# Patient Record
Sex: Male | Born: 1970 | Race: White | Hispanic: No | Marital: Married | Smoking: Never smoker
Health system: Southern US, Community
[De-identification: ages and names within clinical notes are randomized; demographics above are authoritative.]

---

## 2008-06-08 ENCOUNTER — Ambulatory Visit (HOSPITAL_BASED_OUTPATIENT_CLINIC_OR_DEPARTMENT_OTHER): Admission: RE | Admit: 2008-06-08 | Discharge: 2008-06-08 | Payer: Self-pay | Admitting: Orthopedic Surgery

## 2010-08-13 ENCOUNTER — Encounter (HOSPITAL_COMMUNITY)
Admission: RE | Admit: 2010-08-13 | Discharge: 2010-08-13 | Disposition: A | Discharge status: Home / Self Care | Payer: 59 | Source: Ambulatory Visit | Attending: Neurosurgery | Admitting: Neurosurgery

## 2010-08-13 DIAGNOSIS — Z01818 Encounter for other preprocedural examination: Secondary | ICD-10-CM | POA: Insufficient documentation

## 2010-08-13 DIAGNOSIS — Z01812 Encounter for preprocedural laboratory examination: Secondary | ICD-10-CM | POA: Insufficient documentation

## 2010-08-13 LAB — CBC
HCT: 47.1 % (ref 39.0–52.0)
Hemoglobin: 16.6 g/dL (ref 13.0–17.0)
MCH: 31.3 pg (ref 26.0–34.0)
MCHC: 35.2 g/dL (ref 30.0–36.0)
MCV: 88.9 fL (ref 78.0–100.0)
Platelets: 186 10*3/uL (ref 150–400)
RBC: 5.3 MIL/uL (ref 4.22–5.81)
RDW: 12.4 % (ref 11.5–15.5)
WBC: 7.8 10*3/uL (ref 4.0–10.5)

## 2010-08-13 LAB — BASIC METABOLIC PANEL
BUN: 16 mg/dL (ref 6–23)
CO2: 29 mEq/L (ref 19–32)
Calcium: 9.8 mg/dL (ref 8.4–10.5)
Chloride: 103 mEq/L (ref 96–112)
Creatinine, Ser: 1.24 mg/dL (ref 0.4–1.5)
GFR calc Af Amer: >60 mL/min (ref >60)
GFR calc non Af Amer: >60 mL/min (ref >60)
Glucose, Bld: 103 mg/dL — ABNORMAL HIGH (ref 70–99)
Potassium: 4.1 mEq/L (ref 3.5–5.1)
Sodium: 138 mEq/L (ref 135–145)

## 2010-08-13 LAB — SURGICAL PCR SCREEN
MRSA, PCR: NEGATIVE
Staphylococcus aureus: NEGATIVE

## 2010-08-19 ENCOUNTER — Observation Stay (HOSPITAL_COMMUNITY)
Admission: RE | Admit: 2010-08-19 | Discharge: 2010-08-20 | Disposition: A | Discharge status: Home / Self Care | Payer: 59 | Source: Ambulatory Visit | Attending: Neurosurgery | Admitting: Neurosurgery

## 2010-08-19 ENCOUNTER — Observation Stay (HOSPITAL_COMMUNITY): Payer: 59

## 2010-08-19 DIAGNOSIS — M47812 Spondylosis without myelopathy or radiculopathy, cervical region: Secondary | ICD-10-CM | POA: Insufficient documentation

## 2010-08-19 DIAGNOSIS — M5020 Other cervical disc displacement, unspecified cervical region: Principal | ICD-10-CM | POA: Insufficient documentation

## 2010-08-19 DIAGNOSIS — M5030 Other cervical disc degeneration, unspecified cervical region: Secondary | ICD-10-CM | POA: Insufficient documentation

## 2010-08-19 DIAGNOSIS — Z01812 Encounter for preprocedural laboratory examination: Secondary | ICD-10-CM | POA: Insufficient documentation

## 2010-08-19 DIAGNOSIS — E785 Hyperlipidemia, unspecified: Secondary | ICD-10-CM | POA: Insufficient documentation

## 2010-08-19 DIAGNOSIS — K219 Gastro-esophageal reflux disease without esophagitis: Secondary | ICD-10-CM | POA: Insufficient documentation

## 2010-08-21 NOTE — Op Note (Signed)
[name], [name]               ACCOUNT NO.:  [id number]  MEDICAL RECORD NO.:  [id number]           PATIENT TYPE:  O  LOCATION:  3526                         FACILITY:  [facility]  PHYSICIAN:  Hewitt Shorts, M.D.DATE OF BIRTH:  1970/08/09  DATE OF PROCEDURE:  08/19/2010 DATE OF DISCHARGE:                              OPERATIVE REPORT   PREOPERATIVE DIAGNOSES:  C6-C7 cervical disk herniation, cervical degenerative disk disease, cervical spondylosis, and cervical radiculopathy.  POSTOPERATIVE DIAGNOSES:  C6-C7 cervical disk herniation, cervical degenerative disk disease, cervical spondylosis, and cervical radiculopathy.  PROCEDURE:  C6-C7 anterior cervical decompression and arthrodesis with allograft and Tether cervical plating.  SURGEON:  Hewitt Shorts, MD  ASSISTANT:  Clydene Fake, MD  ANESTHESIA:  General endotracheal.  INDICATIONS:  The patient is a 40 year old man who presented with an acute left C7 cervical radiculopathy.  MRI scan revealed a left C6-C7 foraminal disk herniation and decision made to proceed with single-level ACDF.  DESCRIPTION OF PROCEDURE:  The patient was brought to the operating room, placed under general endotracheal anesthesia.  The patient was placed in 10 pounds of Holter traction.  Neck was prepped with Betadine soap solution, draped in a sterile fashion.  A horizontal incision was made on the left side of the neck.  The line of the incision was infiltrated with local anesthetic with epinephrine.  An incision was made, carried down through the subcutaneous tissue and platysma. Bipolar cautery and electrocautery were used to maintain hemostasis. Dissection was carried down through the platysma and then through an avascular plane leaving the sternocleidomastoid, carotid artery, and jugular vein laterally and trachea and esophagus medially.  The ventral aspect of the vertebral column was identified and localizing x-ray taken and the  C6-C7 intervertebral disk space identified.  Diskectomy was begun with an incision of the annulus and continued with microcurettes and pituitary rongeurs.  Anterior osteophytic overgrowth was removed using the osteophyte removal tool and Kerrison punches.  The operating microscope was draped and brought in the field to provide additional navigation, illumination, and visualization.  The remainder of the decompression was performed using microdissection and microsurgical technique.  Cartilaginous endplates of the corresponding vertebra removed were using microcurettes along with the high-speed drill and posterior osteophytic overgrowth was removed using the high-speed drill along with a 2-mm Kerrison punch with a thin footplate.  The posterior longitudinal ligament was carefully removed and we were able to decompress spinal canal and thecal sac.  We then turned our attention to the neural foramina.  Decompression on the right side was done by removing spinal overgrowth and ligament, but on the left side, there was a significant disk herniation that was removed in a piecemeal fashion using a micro hook to mobilize the fragments and removing them with Kerrison punch or pituitary rongeur.  In the end, all loose fragments of disk material were removed from within the foramen and good decompression of the exiting nerve root was achieved.  Once decompression was completed, hemostasis was established with the use of Gelfoam soaked in thrombin.  We then selected a 7-mm allograft implant, it was hydrated in saline solution  and positioned in the intervertebral disk space and countersunk.  We then selected a 14-mm Tether cervical plate that was positioned over the fusion construct and secured to the vertebra with a pair of 4-mm screws bilaterally, placing fixed screws at C6 and variable screws at C7.  Each screw hole was drilled, the screws were placed in an alternating fashion and once all 4 screws  were in place, final tightening was performed.  An x-ray was taken which showed the graft, plate, and screws all in good position.  The alignment was good.  The wound was irrigated with bacitracin solution, checked for hemostasis which was established and confirmed and then we proceeded with closure.  This was closed with interrupted inverted 2-0 undyed Vicryl sutures, subcutaneous and subcuticular closed with interrupted inverted 3-0 undyed Vicryl sutures, skin was approximated with Dermabond.  Procedure was tolerated well.  Estimated blood loss was 75 mL.  Sponge count correct.  Following surgery, the patient is to be reversed from the anesthetic, extubated, and transferred to the recovery room for further care.     Hewitt Shorts, M.D.     RWN/MEDQ  D:  08/19/2010  T:  08/20/2010  Job:  [id number]  Electronically Signed by Shirlean Kelly M.D. on 08/21/2010 12:50:45 PM

## 2010-09-10 NOTE — Op Note (Signed)
NAMEJERARDO, COSTABILE               ACCOUNT NO.:  [id number]   MEDICAL RECORD NO.:  [id number]          PATIENT TYPE:  AMB   LOCATION:  DSC                          FACILITY:  [facility]   PHYSICIAN:  Rodney A. Mortenson, M.D.DATE OF BIRTH:  1970/06/24   DATE OF PROCEDURE:  06/08/2008  DATE OF DISCHARGE:                               OPERATIVE REPORT   JUSTIFICATION:  A 40 year old male sustained a fracture of his left  fibula which was oblique and an injury with a syndesmosis diastasis of  the left ankle.  The patient now admitted for open reduction and  internal fixation.  Complication are discussed preoperatively.  Questions are answered and encouraged.   PREOPERATIVE DIAGNOSIS:  Fracture of left distal fibula with syndesmosis  diastasis of left ankle.   POSTOPERATIVE DIAGNOSIS:  Fracture of left distal fibula with  syndesmosis diastasis of left ankle.   OPERATION:  Open reduction and internal fixation, fracture of fibula  with 7-hole side plate; repair of syndesmosis diastasis with TightRope.   SURGEON:  Lenard Galloway. Chaney Malling, MD   ASSISTANT:  Oris Drone. Petrarca, PA-C   ANESTHESIA:  General.   PROCEDURE:  The patient was placed on the operating table in supine  position.  A pneumatic tourniquet was applied above left upper thigh.  Entire left lower extremity was prepped with DuraPrep and draped out in  the usual manner.  Leg was wrapped out in Esmarch.  Tourniquet was  elevated.  C-arm was used throughout.  An incision was made over the  fibula, started about 1 cm from the tip of the fibula and carried  proximally for about 2 cm above the fracture site.  Soft tissues were  stripped off very carefully.  Care was taken to avoid injury to the  superficial nerves.  Self-retaining retractor was put in place.  Dissection was carried down to the fibula and dissection was carried  further proximal.  Good exposure was achieved.  Manual reduction of the  fracture was achieved and this was  held with bone clamps.  A 7-hole side  plate was then selected and placed over the fracture.  Three to four  screws were proximal to the fracture.  Drill holes were made, measured  and appropriate length screws were applied.  Cortical screws were placed  bicortically proximally and over the distal part of the fibula with 2  large cancellous screws.  C-arm was used throughout and an anatomic  reduction was achieved.  The diastasis was closed and drill hole was  placed through the hole in the plate through the fibula and across the  tibia to the medial side of the tibia using TightRope mechanism.  Guide  pin was passed and a cannulated drill bit was passed over the guide pin.  A small incision was made over the medial side.  Soft tissues were  stripped away using a curved clamp.  The TightRope mechanism was then  passed through the plate through the fibula and across the tibia to the  medial side above the medial malleolus.  Traction was placed on the  TightRope and the fixation  device medially flipped back down onto the  cortex.  Great deal of attention was then placed on the TightRope with  the diastasis reduced manually and then sutured down very snugly.  Excellent anatomic reduction of the talus in the mortise was achieved.  The diastasis was closed.  An anatomic reduction of the fractures were  achieved.  Extra TightRope was cut off with a knife.  Wound was  irrigated.  Vicryl was used to close the muscle layers and subcutaneous  tissue and stainless steel staples were used to close the skin.  Sterile  dressing was applied, and the patient returned to the recovery room in  excellent condition.  Technically, this went extremely well.   DISPOSITION:  1. Return to my office on Wednesday.  2. Percocet for pain.  3. Usual postop instructions.  4. Nonweightbearing on the left.      Rodney A. Chaney Malling, M.D.  Electronically Signed     RAM/MEDQ  D:  06/08/2008  T:  06/08/2008  Job:   [id number]

## 2012-01-01 ENCOUNTER — Other Ambulatory Visit: Payer: Self-pay | Admitting: Neurosurgery

## 2012-01-01 DIAGNOSIS — M542 Cervicalgia: Secondary | ICD-10-CM

## 2012-01-01 DIAGNOSIS — M47812 Spondylosis without myelopathy or radiculopathy, cervical region: Secondary | ICD-10-CM

## 2012-01-01 DIAGNOSIS — M5020 Other cervical disc displacement, unspecified cervical region: Secondary | ICD-10-CM

## 2012-01-01 DIAGNOSIS — M5030 Other cervical disc degeneration, unspecified cervical region: Secondary | ICD-10-CM

## 2012-01-12 ENCOUNTER — Ambulatory Visit
Admission: RE | Admit: 2012-01-12 | Discharge: 2012-01-12 | Disposition: A | Discharge status: Home / Self Care | Payer: 59 | Source: Ambulatory Visit | Attending: Neurosurgery | Admitting: Neurosurgery

## 2012-01-12 DIAGNOSIS — M5030 Other cervical disc degeneration, unspecified cervical region: Secondary | ICD-10-CM

## 2012-01-12 DIAGNOSIS — M47812 Spondylosis without myelopathy or radiculopathy, cervical region: Secondary | ICD-10-CM

## 2012-01-12 DIAGNOSIS — M5020 Other cervical disc displacement, unspecified cervical region: Secondary | ICD-10-CM

## 2012-01-12 DIAGNOSIS — M542 Cervicalgia: Secondary | ICD-10-CM

## 2012-02-16 IMAGING — CR DG CERVICAL SPINE 2 OR 3 VIEWS
1 series · 1 of 1 positions shown · non-contrast
Comparison: None.

CLINICAL DATA: C6-7 ACDF.

OPERATIVE CERVICAL SPINE - 2-3 VIEW 08/19/2010:

[view not recorded]
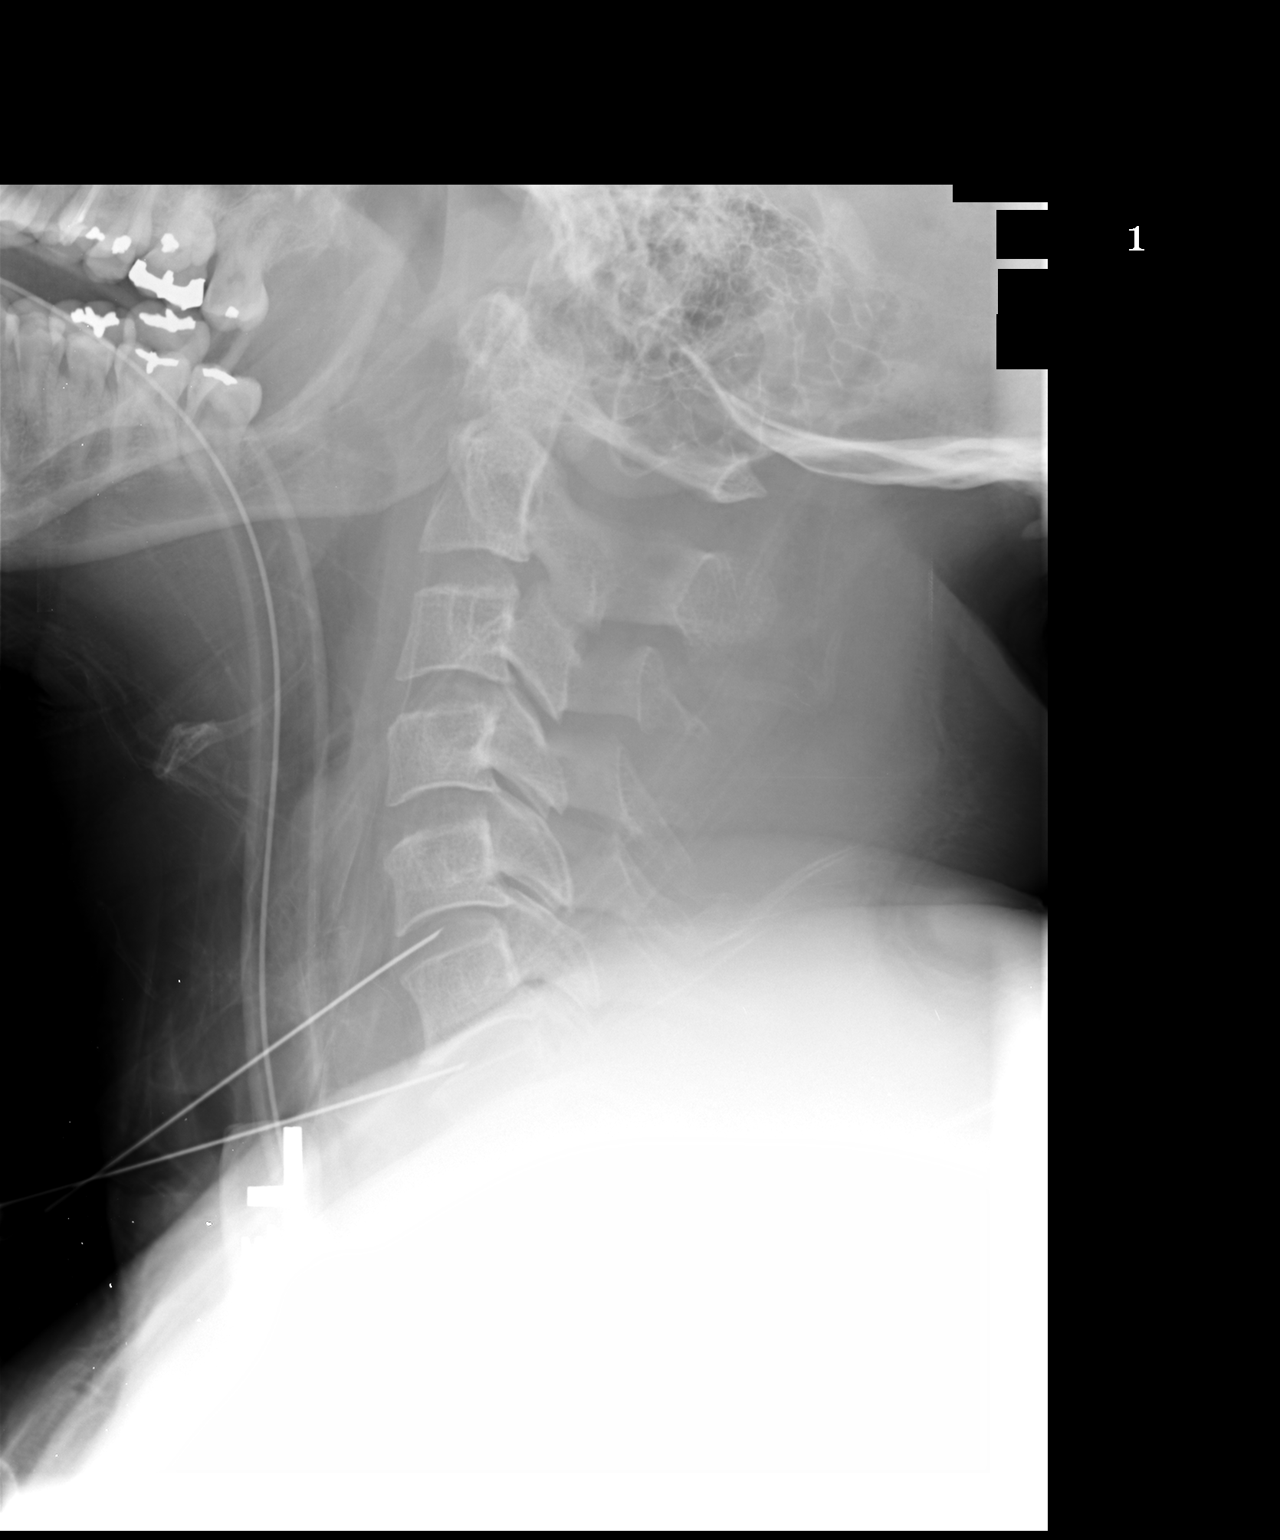

[1 of 1 positions shown; findings below may reference images not displayed]

FINDINGS: Images were submitted for interpretation post-
operatively.  Initial image obtained at 0302 hours demonstrates
localizer needles projected over the C5-6 and C6-7 intervertebral
discs.  Second image obtained at 4849 hours demonstrates ACDF with
hardware at the lower cervical level which I believe is C6-7, based
on the osteophyte arising from the inferior plate of C5.  Alignment
appears anatomic.  Bone fusion plug per disease in the disc space.
IMPRESSION: ACDF with hardware at C6-7 with anatomic alignment.

## 2013-07-11 IMAGING — CT CT CERVICAL SPINE W/O CM
4 series · 16 of 33 positions shown, 19 images · non-contrast
Comparison: Radiographs dated 08/19/2010

CLINICAL DATA: Neck pain.  Right hand numbness and tingling.
Anterior cervical fusion performed at C6-7 on 08/19/2010

CT CERVICAL SPINE WITHOUT CONTRAST
TECHNIQUE: Multidetector CT imaging of the cervical spine was
performed. Multiplanar CT image reconstructions were also
generated.

[Series 3: c spine bone · axial · 0.23mm/px · z∈[+80,+207]mm · 5 of 77 slices shown, 7 images]
[im 13/77  soft-tissue]
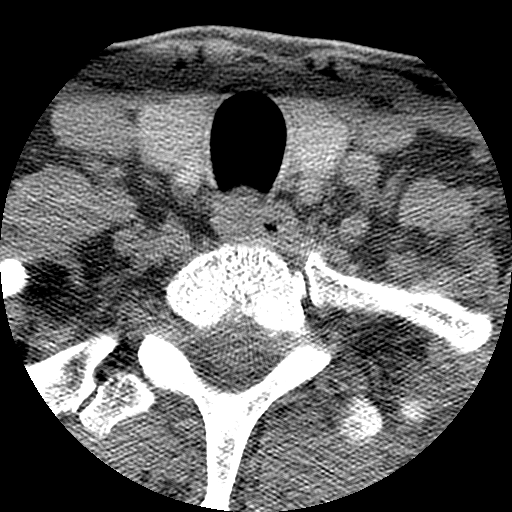
[im 13/77  bone]
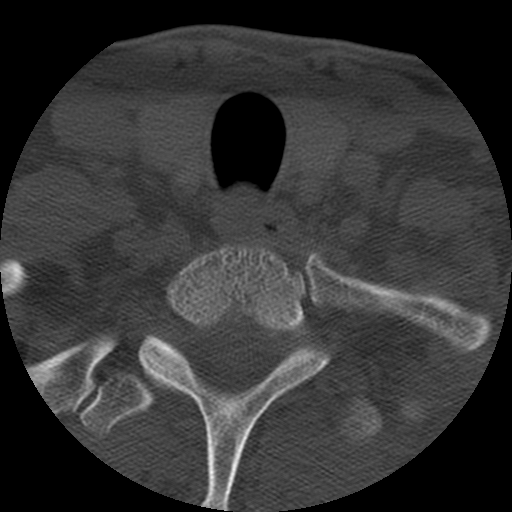
[im 26/77  bone]
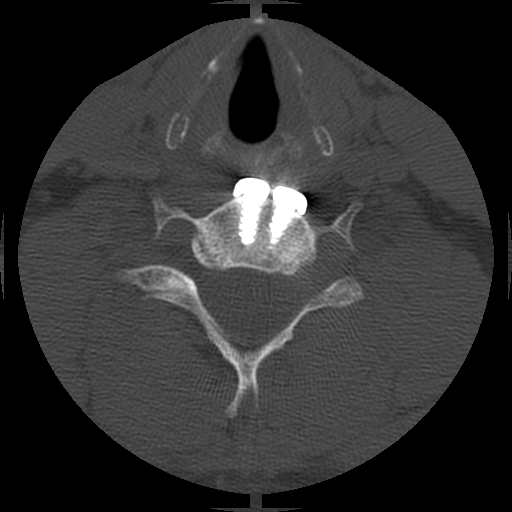
[im 39/77  bone]
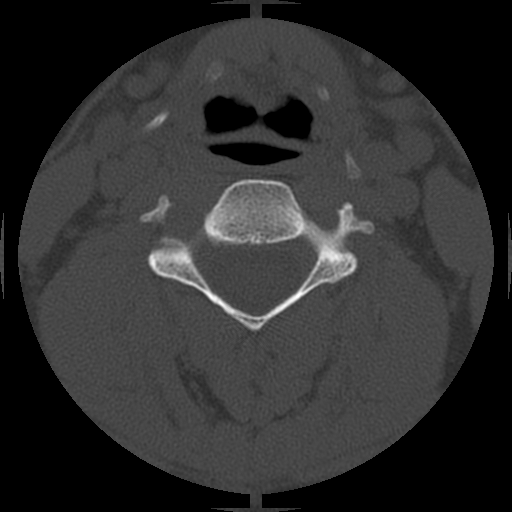
[im 51/77  bone]
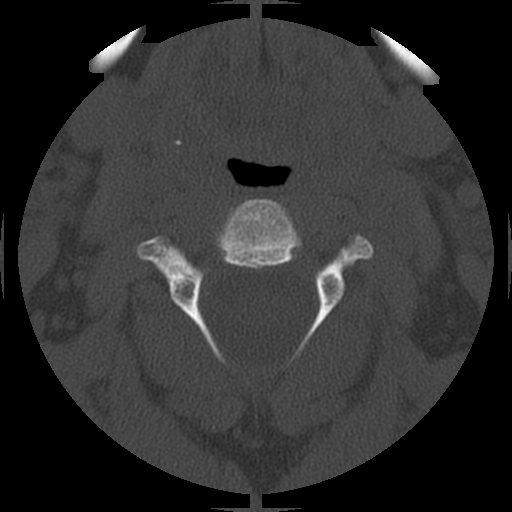
[im 64/77  soft-tissue]
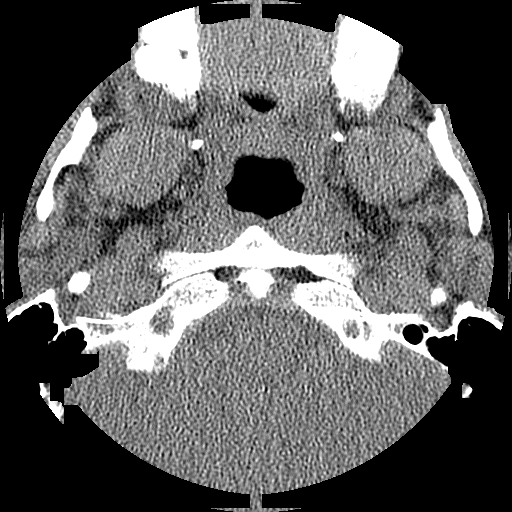
[im 64/77  bone]
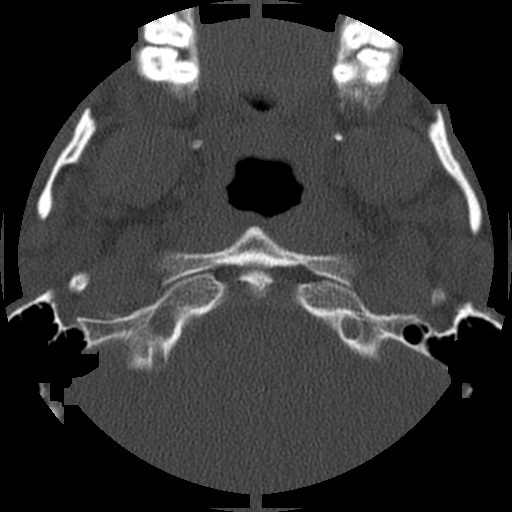

[Series 4: c spine soft · axial · 0.23mm/px · z∈[+80,+145]mm · 3 of 77 slices shown]
[im 13/77  soft-tissue]
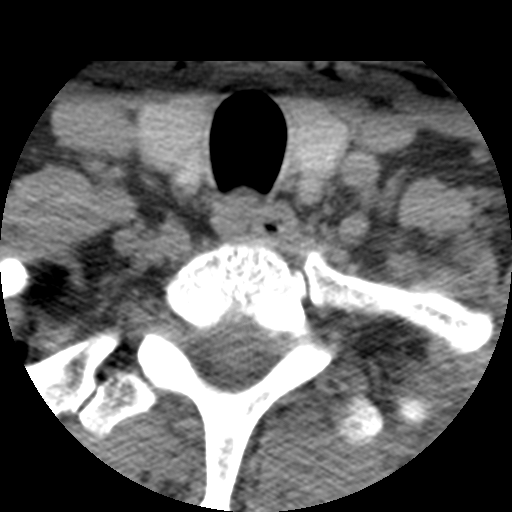
[im 26/77  soft-tissue]
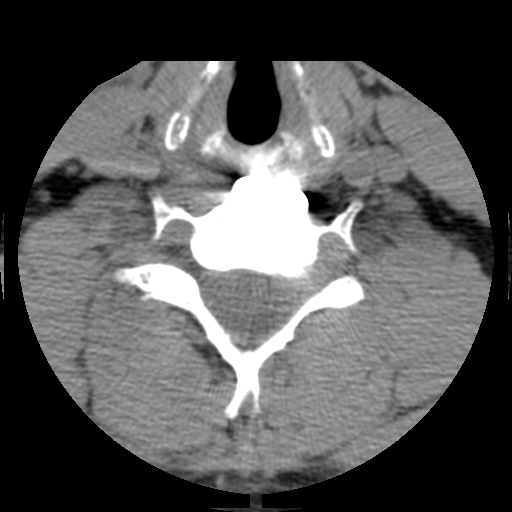
[im 39/77  soft-tissue]
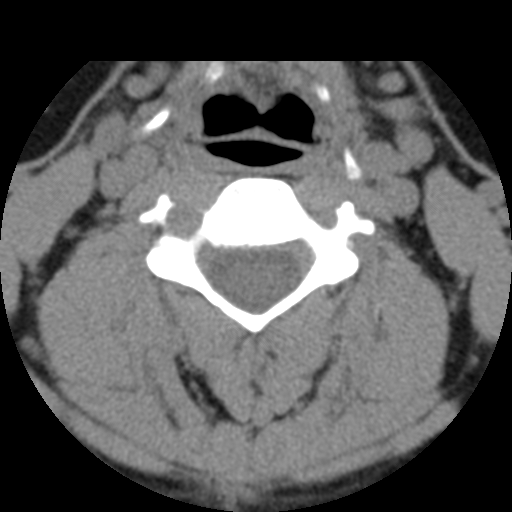

[Series 200: coronal · coronal · 0.38mm/px · 3 of 36 slices shown]
[im 8/36  bone]
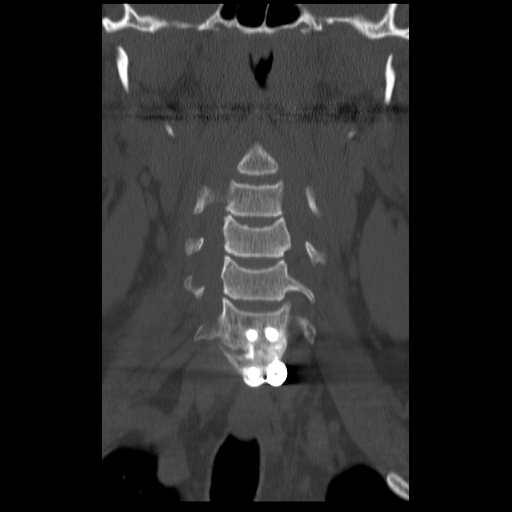
[im 15/36  bone]
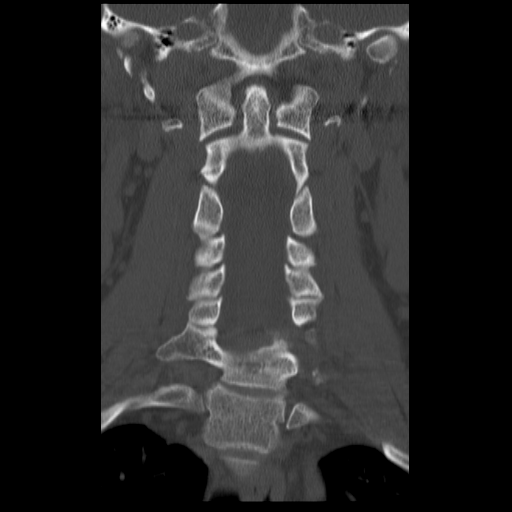
[im 22/36  bone]
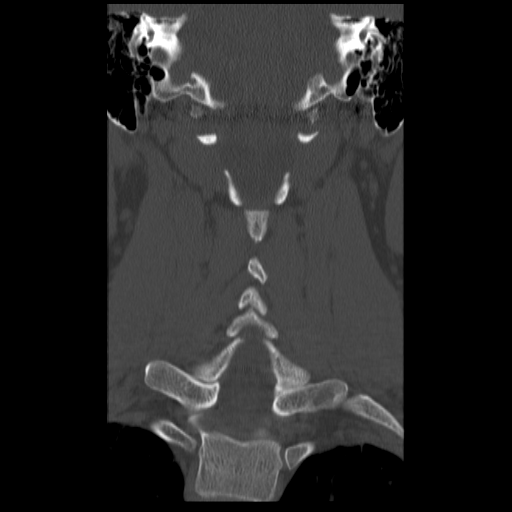

[Series 201: sagittal · sagittal · 0.38mm/px · 5 of 40 slices shown, 6 images]
[im 14/40  bone]
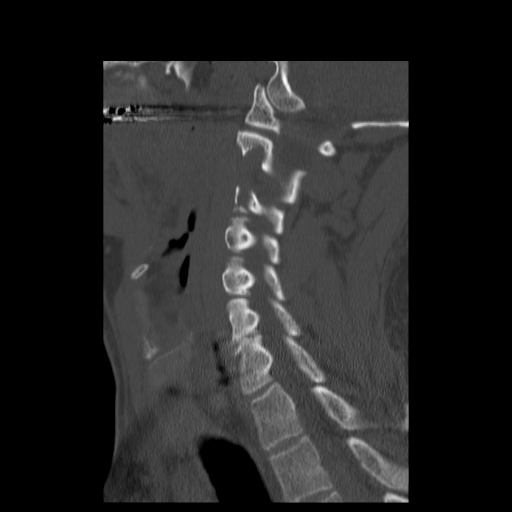
[im 17/40  bone]
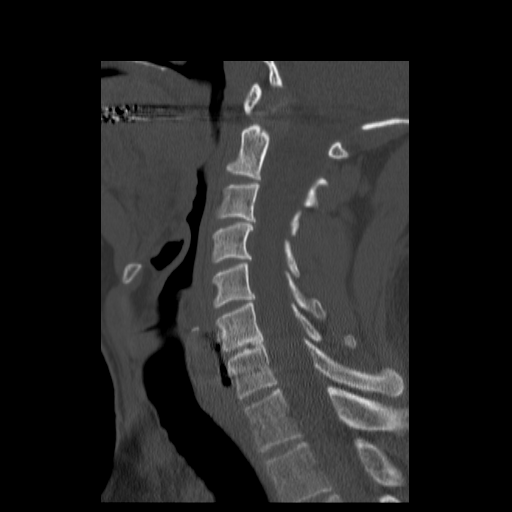
[im 20/40  soft-tissue]
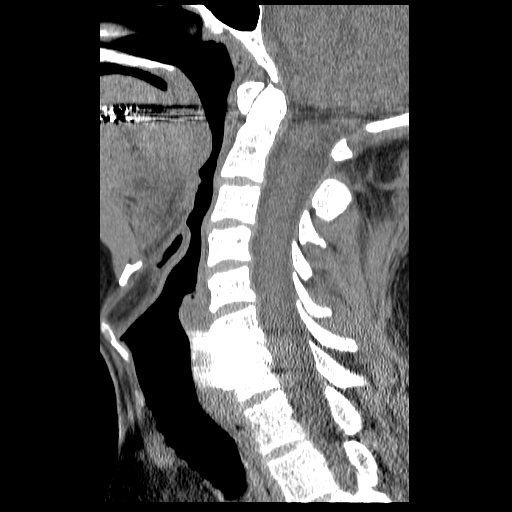
[im 20/40  bone]
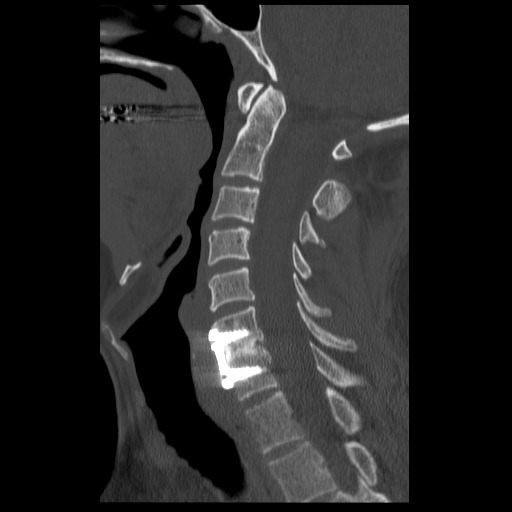
[im 23/40  bone]
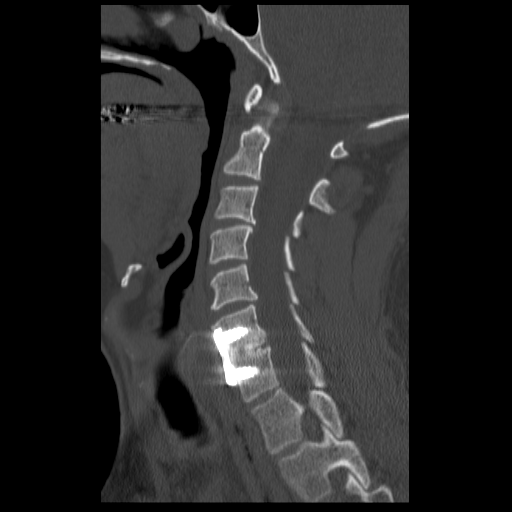
[im 27/40  bone]
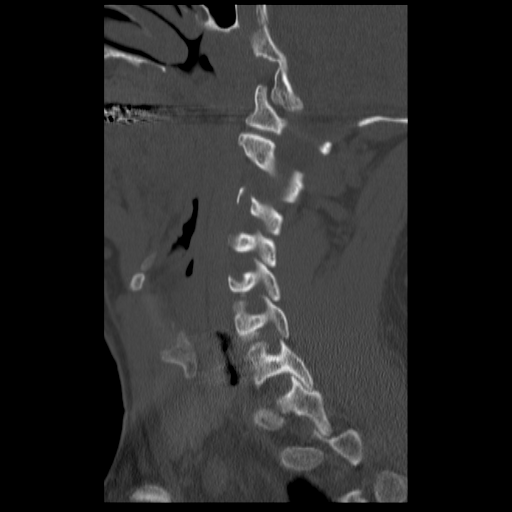

[16 of 33 positions shown; findings below may reference images not displayed]

FINDINGS: The scan extends from the upper clivus through T1-2.
There is a solid interbody fusion at C6-7.  Hardware is in good
position.  No new or residual impingement.

There are minimal osteophytes in the endplates at C3-4, C4-5 and C5-
6 to the right and at C3-4 and C5-6 to the left.  No neural
impingement.  No appreciable soft disc protrusion.  No cervical or
foraminal stenosis.  Facet joints are normal.  Paraspinal soft
tissues are normal.
IMPRESSION: 1.  Solid anterior fusion at C6-7.
2.  No appreciable neural impingement.

## 2022-08-21 ENCOUNTER — Ambulatory Visit (HOSPITAL_COMMUNITY)
Admission: EM | Admit: 2022-08-21 | Discharge: 2022-08-21 | Disposition: A | Discharge status: Home / Self Care | Payer: 59 | Attending: Sports Medicine | Admitting: Sports Medicine

## 2022-08-21 ENCOUNTER — Ambulatory Visit (INDEPENDENT_AMBULATORY_CARE_PROVIDER_SITE_OTHER): Payer: 59

## 2022-08-21 ENCOUNTER — Encounter (HOSPITAL_COMMUNITY): Payer: Self-pay

## 2022-08-21 DIAGNOSIS — S92321A Displaced fracture of second metatarsal bone, right foot, initial encounter for closed fracture: Secondary | ICD-10-CM

## 2022-08-21 MED ORDER — IBUPROFEN 800 MG PO TABS: 800.0000 mg | ORAL_TABLET | Freq: Three times a day (TID) | ORAL | 0 refills | Status: DC
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
## 2022-08-21 NOTE — Discharge Instructions (Addendum)
Your x-rays showed a fracture of your second metatarsal, bone in your foot.  I have sent to your pharmacy some ibuprofen that you can use for pain.  Member to ice multiple times a day and elevate.  Follow-up with the foot and ankle doctor in 5 to 7 days.  Continue to use your crutches to remain nonweightbearing.

## 2024-02-16 ENCOUNTER — Ambulatory Visit: Admitting: Podiatry

## 2024-02-16 ENCOUNTER — Encounter: Payer: Self-pay | Admitting: Podiatry

## 2024-02-16 ENCOUNTER — Ambulatory Visit

## 2024-02-16 DIAGNOSIS — M79671 Pain in right foot: Secondary | ICD-10-CM | POA: Diagnosis not present

## 2024-02-16 DIAGNOSIS — M21611 Bunion of right foot: Secondary | ICD-10-CM

## 2024-02-16 DIAGNOSIS — M2011 Hallux valgus (acquired), right foot: Secondary | ICD-10-CM | POA: Diagnosis not present

## 2024-02-16 NOTE — Progress Notes (Signed)
 Subjective:  Patient ID: [name], male    DOB: [birth year],  MRN: [id number]  Chief Complaint  Patient presents with   Foot Injury    RM 3 Patient is here for right foot injury pain (fractured 2nd metatarsal in 07/2022). Pt states pain in the bunion area and the lateral side of the right foot.    Discussed the use of AI scribe software for clinical note transcription with the patient, who gave verbal consent to proceed.  History of Present Illness [name] is a 53 year old male who presents with right foot pain and bunion formation following a previous fracture.  He has been experiencing ongoing issues with his right foot following a fracture of the second metatarsal in April 2024. There is a sensation of tightness and significant pain primarily in the area of the bunion, with additional mild pain and tightness in other parts of the foot. The pain is bothersome but not unbearable.  After the fracture, he was in a walking boot, though he may not have worn it as long as recommended. He experiences a sensation akin to having a 'rock in his shoe' at the bottom of the second toe, though this area does not cause significant pain. The joint in the area frequently 'pops and cracks'.  A noticeable bunion has developed on the right foot after the fracture. He recalls no significant family history of bunions, though his mother may have a mild case. Wider shoes have been tried to accommodate the bunion, but this has not alleviated the discomfort.  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 He is also involved in coaching and playing volleyball, which requires some physical activity. He denies smoking and has not had his vitamin D levels checked previously.      Objective:    Physical Exam VASCULAR: DP and PT pulse palpable. Foot is warm and well-perfused. Capillary fill time is  brisk. DERMATOLOGIC: Normal skin turgor, texture, and temperature. No open lesions, rashes, or ulcerations. NEUROLOGIC: Normal sensation to light touch and pressure. No paresthesias. ORTHOPEDIC: Large bunion medially on right foot with hallux valgus. Hypermobility of first tarsal metatarsal joint, deviation of hallux, limited dorsiflexion, elevation of second metatarsal head.    No images are attached to the encounter.    Results RADIOLOGY Right foot X-ray: Healed second metatarsal fracture with slight elevation, hallux valgus deformity with large intermetatarsal angle of 15 degrees, increased hallux abductus angle, deviated tibial sesamoid position (02/16/2024)   Assessment:   1. Hallux valgus with bunions, right      Plan:  Patient was evaluated and treated and all questions answered.  Assessment and Plan Assessment & Plan Painful right hallux valgus (bunion) with first tarsometatarsal joint hypermobility and limited dorsiflexion Chronic painful hallux valgus deformity with large bunion on the right foot, associated with first tarsometatarsal joint hypermobility and limited dorsiflexion. The deformity has worsened post-fracture, likely due to soft tissue damage and joint inflammation. Surgical intervention is recommended to address the deformity and hypermobility. - Recommended Lapidus and Akin procedure to address hallux valgus and hypermobility. - Discussed surgical risks including infection, scar tissue, wound healing issues, numbness, and potential nerve damage. - Recovery involves non-weight bearing for three weeks, followed by eight weeks in a boot, with potential return to work from home after one week if pain is controlled. - Driving is restricted until six weeks post-surgery if a post-op  shoe is used. - Full return to regular activities, including volleyball, is expected in two to three months. - Ordered vitamin D and calcium level checks prior to surgery. - Will [facility] for [facility].  Healed right second metatarsal fracture with residual deformity and pain Healed fracture of the right second metatarsal with residual deformity and pain. The fracture has healed with slight elevation, contributing to joint inflammation and pain. The deformity is likely exacerbated by the hallux valgus and hypermobility. - Addressed residual deformity and pain through surgical correction of hallux valgus and hypermobility.      No follow-ups on file.

## 2024-02-16 NOTE — Patient Instructions (Signed)
  VISIT SUMMARY: Today, you were seen for ongoing right foot pain and bunion formation following a previous fracture. You have been experiencing significant pain and tightness in the area of the bunion, with additional mild pain in other parts of the foot. We discussed your symptoms, the impact on your daily activities, and the best course of action moving forward.  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 This condition, along with hypermobility (excessive movement) of the first tarsometatarsal joint and limited upward movement of the foot, has worsened after your fracture. We recommend a Lapidus procedure, which is a surgical intervention to correct the deformity and hypermobility. The surgery carries risks such as infection, scar tissue, wound healing issues, numbness, and potential nerve damage or discomfort, stiffness and swelling in the joint which is usually tmeporary. Recovery will involve non-weight bearing for three weeks, followed by eight weeks in a boot. You may be able to work from home after one week if pain is controlled. Driving is restricted until six weeks post-surgery if a post-op shoe is used. Full return to regular activities, including volleyball, is expected in two to three months. We will also check your vitamin D and calcium levels before surgery and coordinate with the [facility] for surgery authorization.  -HEALED RIGHT SECOND METATARSAL FRACTURE WITH RESIDUAL DEFORMITY AND PAIN: Your previous fracture of the right second metatarsal has healed, but there is a slight deformity and ongoing pain. This is likely contributing to the inflammation and pain in your foot. The surgical correction of your hallux valgus and hypermobility should also address this residual deformity and  pain.  INSTRUCTIONS: We will check your vitamin D and calcium levels before surgery. We will also coordinate with the [facility] for surgery authorization. Please follow up with us  if you have any questions or concerns.                      Contains text generated by [employer].                                 Contains text generated by [employer].

## 2024-02-24 ENCOUNTER — Telehealth: Payer: Self-pay | Admitting: Podiatry

## 2024-02-24 NOTE — Telephone Encounter (Signed)
 Called patient to schedule surgery, 04/11/24. Patient confirmed receipt of surgery bag and is aware [facility] will call 24-48 hours prior to surgery with arrival time. Patient not on any GLP1 or blood thinners. Patients confirmed pharmacy is entered in chart and set as preferred.

## 2024-03-11 ENCOUNTER — Telehealth: Payer: Self-pay | Admitting: Podiatry

## 2024-03-11 NOTE — Telephone Encounter (Signed)
 DOS- 04/11/2024 (WILL BE DONE AT [facility])  AIKEN OSTEOTOMY RT- [id number] LAPIDUS PROCEDURE INC. BUNIONECTOMY RT- [id number]  [facility] EFFECTIVE DATE- 04/29/2023  DEDUCTIBLE- $500 REMAINING- $500 OOP- $3000 REMAINING- $2880 FAMILY DEDUCTIBLE- $1000 REMAINING- $989.66 OOP- $6000 REMAINING- $5735.94 COINSURANCE- 20%  PER [employer] PORTAL, PRIOR AUTH FOR CPT CODES 71689 AND [id number] HAVE BEEN APPROVED FROM 04/11/2024-07/10/2024. AUTH# [id number]

## 2024-03-24 LAB — CALCIUM: Calcium: 10.2 mg/dL (ref 8.7–10.2)

## 2024-03-24 LAB — VITAMIN D 25 HYDROXY (VIT D DEFICIENCY, FRACTURES): Vit D, 25-Hydroxy: 26.1 ng/mL — ABNORMAL LOW (ref 30.0–100.0)

## 2024-03-29 ENCOUNTER — Ambulatory Visit: Payer: Self-pay | Admitting: Podiatry

## 2024-04-11 ENCOUNTER — Other Ambulatory Visit: Payer: Self-pay | Admitting: Podiatry

## 2024-04-11 DIAGNOSIS — M2011 Hallux valgus (acquired), right foot: Secondary | ICD-10-CM | POA: Diagnosis not present

## 2024-04-11 MED ORDER — HYDROCODONE-ACETAMINOPHEN 5-325 MG PO TABS: 1.0000 | ORAL_TABLET | Freq: Four times a day (QID) | ORAL | 0 refills | Status: AC | PRN

## 2024-04-11 MED ORDER — GABAPENTIN 300 MG PO CAPS: 300.0000 mg | ORAL_CAPSULE | Freq: Three times a day (TID) | ORAL | 0 refills | Status: DC

## 2024-04-11 MED ORDER — ASPIRIN 81 MG PO TBEC: 81.0000 mg | DELAYED_RELEASE_TABLET | Freq: Two times a day (BID) | ORAL | 0 refills | Status: DC

## 2024-04-11 MED ORDER — IBUPROFEN 800 MG PO TABS: 800.0000 mg | ORAL_TABLET | Freq: Three times a day (TID) | ORAL | 0 refills | Status: AC

## 2024-04-19 ENCOUNTER — Encounter

## 2024-04-19 ENCOUNTER — Ambulatory Visit (INDEPENDENT_AMBULATORY_CARE_PROVIDER_SITE_OTHER)

## 2024-04-19 ENCOUNTER — Encounter: Admitting: Podiatry

## 2024-04-19 ENCOUNTER — Ambulatory Visit

## 2024-04-19 DIAGNOSIS — M2011 Hallux valgus (acquired), right foot: Secondary | ICD-10-CM | POA: Diagnosis not present

## 2024-04-19 DIAGNOSIS — M21611 Bunion of right foot: Secondary | ICD-10-CM

## 2024-04-19 NOTE — Progress Notes (Signed)
"  °  Subjective:  Patient ID: [name], male    DOB: Dec 18, 1970,  MRN: [id number]  Chief Complaint  Patient presents with   Routine Post Op    rm4POV #1 DOS 04/11/2024 RT KATRINA OSTEOTOMY, RT LAPIDUS PROCEDURE INC BUNIONECTOMY     DOS: 04/11/24 Procedure: Right akin osteotomy, lapidus bunionectomy  53 y.o. male returns for post-op check.  He is doing well in his recovery and has been compliant with nonweightbearing in the cam walker.  He does admit to accidentally placing weight on it once.  Review of Systems: Negative except as noted in the HPI. Denies N/V/F/Ch.  History reviewed. No pertinent past medical history. Current Medications[1]  Tobacco Use History[2]  Allergies[3] Objective:  There were no vitals filed for this visit. There is no height or weight on file to calculate BMI. Constitutional Well developed. Well nourished.  Vascular Foot warm and well perfused. Capillary refill normal to all digits.  Calf supple, nontender, no erythema, negative Homans  Neurologic Normal speech. Oriented to person, place, and time. Epicritic sensation to light touch grossly present bilaterally.  Dermatologic Skin healing well without signs of infection.  Sutures intact.  Skin edges well coapted without signs of infection. Mild edema around surgical site.   Orthopedic: Mild tenderness to palpation noted about the surgical site within normal limits.   Radiographs: 3 views of the right foot were acquired today.  These show Lapidus and Aiken procedures with good reduction of deformity and hardware intact.  No complications are identified. Assessment:   1. Hallux valgus with bunions, right    Plan:  Patient was evaluated and treated and all questions answered. Doing well in post operative recovery.   S/p Right foot surgery  -Progressing as expected post-operatively.  Patient can begin to shower and then redress the foot with dry sterile dressings.  He is not to let it soak in  a bath or otherwise. -XR: As above, consistent with postoperative changes.  No complications identified. -WB Status: Non weight bearing in CAM walker. -Sutures: intact, will be removed at next post-op visit. -Medications: None needed at today's visit -Foot redressed. -XR at next visit: not needed  Prentice Ovens, DPM     [1]  Current Outpatient Medications:    aspirin  EC 81 MG tablet, Take 1 tablet (81 mg total) by mouth 2 (two) times daily. Swallow whole., Disp: 60 tablet, Rfl: 0   gabapentin  (NEURONTIN ) 300 MG capsule, Take 1 capsule (300 mg total) by mouth 3 (three) times daily., Disp: 21 capsule, Rfl: 0   ibuprofen  (ADVIL ) 800 MG tablet, Take 1 tablet (800 mg total) by mouth 3 (three) times daily., Disp: 30 tablet, Rfl: 0   pantoprazole (PROTONIX) 40 MG tablet, Take 40 mg by mouth daily., Disp: , Rfl:  [2]  Social History Tobacco Use  Smoking Status Never  Smokeless Tobacco Never  [3] No Known Allergies  "

## 2024-05-03 ENCOUNTER — Encounter

## 2024-05-03 DIAGNOSIS — M2011 Hallux valgus (acquired), right foot: Secondary | ICD-10-CM

## 2024-05-03 DIAGNOSIS — M21611 Bunion of right foot: Secondary | ICD-10-CM

## 2024-05-03 NOTE — Progress Notes (Signed)
 Patient presents for post-op visit today, POV #2 DOS 04/11/2024 RT AIKEN OSTEOTOMY, RT LAPIDUS PROCEDURE INC BUNIONECTOMY   Doing good since last visit. Itching though..  Notes: n/a  Vital Signs: Today's Vitals   05/03/24 1016  PainSc: 0-No pain      Radiographs: []  Taken [x]  Not taken  Surgical Site Assessment:  - Dressing:  []  Minimal dry blood, intact []  Reinforced   []  Changed     -Notes: no dressing  - Incision:  [x]  CDI (clean, dry, intact)  [x]  Mild erythema  []  Drainage noted   -Notes: medial incision between 1st and 2nd has some clear-light yellow drainage.   - Swelling:  []  None  [x]  Mild  []  Moderate   []  Significant     -Notes: n/a  - Bruising:  [x]  None  []  Present: n/a   - Sutures/Staples:  []  None [x]  Intact  [x]  Removed Today  []  Plan to remove at next visit   -Cast/Splint/Pins: [x]  None []  Intact []  Removed Today []  Plan to remove at next visit []  Replaced  -Signs of infection:  [x]  None  []  Present - Describe: n/a  -DME:    []  None [x]  AFW []  Surgical shoe []  Cast  []  Splint  -Walking status:  []  Full WB  []  Partial WB  [x]  NWB  -Utilizing device:  []  None []  Knee Scooter [x]  Crutches []  Wheelchair    DVT assessment:  [x]  Denies symptoms []  Chest pain/SOB []  Pain in calf/redness/warmth   Redressed DSD and ace wrap. Educated on signs of infection, proper dressing care, pain management, and weight bearing status. Patient will contact provider with any new or worsening symptoms. The provider assessed the patient today and reviewed instructions regarding plan of care.

## 2024-05-03 NOTE — Progress Notes (Signed)
 Doing well sutures removed uneventfully toe spacer compression sleeve applied, encourage range of motion of the toe at the base of the toe to maximize dorsiflexion plantarflexion of the hallux.  Appears to be healing well.  Pain is well-controlled.  Continue nonweightbearing for 10 more days and then can begin gradual heel weightbearing with crutches partial on the right foot.  Follow-up with me in 3 weeks for new x-rays.

## 2024-05-17 ENCOUNTER — Ambulatory Visit

## 2024-05-17 ENCOUNTER — Ambulatory Visit (INDEPENDENT_AMBULATORY_CARE_PROVIDER_SITE_OTHER): Admitting: Podiatry

## 2024-05-17 VITALS — Ht 69.0 in | Wt 190.0 lb

## 2024-05-17 DIAGNOSIS — M2011 Hallux valgus (acquired), right foot: Secondary | ICD-10-CM

## 2024-05-17 DIAGNOSIS — M21611 Bunion of right foot: Secondary | ICD-10-CM | POA: Diagnosis not present

## 2024-05-19 NOTE — Progress Notes (Signed)
"  °  Subjective:  Patient ID: [name], male    DOB: 1970/12/14,  MRN: [id number]  Chief Complaint  Patient presents with   Post-op Follow-up    RM 2 POV #3 DOS 04/11/2024 RT AIKEN OSTEOTOMY, RT LAPIDUS PROCEDURE INC BUNIONECTOMY (Reshard Guillet). Pt states tightness with stretching the right foot, minimum pain/discomfort.     54 y.o. male returns for post-op check.  Overall doing very well  Review of Systems: Negative except as noted in the HPI. Denies N/V/F/Ch.   Objective:  There were no vitals filed for this visit. Body mass index is 28.06 kg/m. Constitutional Well developed. Well nourished.  Vascular Foot warm and well perfused. Capillary refill normal to all digits.  Calf is soft and supple, no posterior calf or knee pain, negative Homans' sign  Neurologic Normal speech. Oriented to person, place, and time. Epicritic sensation to light touch grossly present bilaterally.  Dermatologic Incision is well-healed and not hypertrophic  Orthopedic: Normal edema and he has no pain to palpation noted about the surgical site.   Multiple view plain film radiographs: Good correction is maintained, good early consolidation across the fusion and osteotomy site. Assessment:   1. Hallux valgus with bunions, right    Plan:  Patient was evaluated and treated and all questions answered.  S/p foot surgery right -Progressing as expected post-operatively. -XR: Noted above good consolidation and fusion across osteotomy site -WB Status: Weightbearing as tolerated in cam walker boot -Sutures: Previously removed. -Medications: No refills required - Continue CAM boot for 2 more weeks and then can transition back to regular shoe gear.  Return in 6 weeks for new x-rays  No follow-ups on file.  "

## 2024-05-24 ENCOUNTER — Encounter: Admitting: Podiatry

## 2024-06-07 ENCOUNTER — Encounter: Admitting: Podiatry
